# Patient Record
Sex: Male | Born: 1965 | Race: White | Hispanic: No | Marital: Married | Smoking: Never smoker
Health system: Southern US, Community
[De-identification: ages and names within clinical notes are randomized; demographics above are authoritative.]

## PROBLEM LIST (undated history)

## (undated) DIAGNOSIS — R569 Unspecified convulsions: Secondary | ICD-10-CM

## (undated) DIAGNOSIS — I2699 Other pulmonary embolism without acute cor pulmonale: Secondary | ICD-10-CM

## (undated) DIAGNOSIS — IMO0001 Reserved for inherently not codable concepts without codable children: Secondary | ICD-10-CM

## (undated) DIAGNOSIS — M1990 Unspecified osteoarthritis, unspecified site: Secondary | ICD-10-CM

## (undated) DIAGNOSIS — Z531 Procedure and treatment not carried out because of patient's decision for reasons of belief and group pressure: Secondary | ICD-10-CM

## (undated) DIAGNOSIS — I82409 Acute embolism and thrombosis of unspecified deep veins of unspecified lower extremity: Secondary | ICD-10-CM

## (undated) HISTORY — PX: TONSILLECTOMY: SUR1361

## (undated) HISTORY — PX: ANKLE RECONSTRUCTION: SHX1151

---

## 2001-08-29 ENCOUNTER — Emergency Department (HOSPITAL_COMMUNITY): Admission: EM | Admit: 2001-08-29 | Discharge: 2001-08-30 | Payer: Self-pay | Admitting: *Deleted

## 2002-07-04 ENCOUNTER — Ambulatory Visit (HOSPITAL_COMMUNITY): Admission: RE | Admit: 2002-07-04 | Discharge: 2002-07-04 | Payer: Self-pay | Admitting: Orthopedic Surgery

## 2002-07-04 ENCOUNTER — Encounter: Payer: Self-pay | Admitting: Orthopedic Surgery

## 2003-11-24 ENCOUNTER — Encounter: Admission: RE | Admit: 2003-11-24 | Discharge: 2003-11-24 | Payer: Self-pay | Admitting: Family Medicine

## 2004-01-13 ENCOUNTER — Ambulatory Visit (HOSPITAL_COMMUNITY): Admission: RE | Admit: 2004-01-13 | Discharge: 2004-01-13 | Payer: Self-pay | Admitting: *Deleted

## 2004-02-20 ENCOUNTER — Encounter: Admission: RE | Admit: 2004-02-20 | Discharge: 2004-02-20 | Payer: Self-pay | Admitting: Neurosurgery

## 2004-03-06 ENCOUNTER — Encounter: Admission: RE | Admit: 2004-03-06 | Discharge: 2004-03-06 | Payer: Self-pay | Admitting: Neurosurgery

## 2004-03-26 ENCOUNTER — Encounter: Admission: RE | Admit: 2004-03-26 | Discharge: 2004-03-26 | Payer: Self-pay | Admitting: Neurosurgery

## 2006-05-19 ENCOUNTER — Encounter: Admission: RE | Admit: 2006-05-19 | Discharge: 2006-05-19 | Payer: Self-pay | Admitting: Neurosurgery

## 2006-07-09 ENCOUNTER — Encounter: Admission: RE | Admit: 2006-07-09 | Discharge: 2006-07-09 | Payer: Self-pay | Admitting: Neurosurgery

## 2010-10-27 ENCOUNTER — Encounter: Payer: Self-pay | Admitting: Neurosurgery

## 2014-05-23 ENCOUNTER — Other Ambulatory Visit: Payer: Self-pay | Admitting: Surgical

## 2014-05-29 ENCOUNTER — Encounter (HOSPITAL_COMMUNITY): Payer: Self-pay | Admitting: Pharmacist

## 2014-05-30 NOTE — H&P (Signed)
Ronald Porter DOB: 04/25/1966 Male   Chief Complaint: right knee pain  History of Present Illness  The patient is a 48 year old male who presents with the chief complaint of right knee pain. He says that he has had this four months. He says that four months ago he was doing some work in the yard when he stepped down and felt like her hyperextended his knee. He also has history of psoriatic arthritis. He feels that because of this trauma he had a flare of the arthritis. He says it has been getting progressively worse over the past four months. He is having some locking and catching. Most of his pain is along the medial aspect of his knee. He is having some swelling as well. 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 It requires that he lifts heavy weights and has to push and pull heavy weights. He has had to be out of work for the past couple of months as a result of his right knee. He was seen at the [facility] and his rheumatologist. He did have x-rays as well as MRI of the right knee. He was told that he had a medial meniscus tear. 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  Allergies Simvastatin *ANTIHYPERLIPIDEMICS* NexIUM *ULCER DRUGS*  Family History Cancer Father, Mother. Drug / Alcohol Addiction Father. Heart Disease Father. Hypertension Father. Osteoarthritis Father.  Social History Children 1 Current drinker 05/12/2014: Currently drinks beer, wine and hard liquor only occasionally per week Current work status working full time Exercise Exercises rarely; does running / walking Living situation live with spouse Marital status married Tobacco / smoke exposure no Tobacco use Never smoker.   Medication History enoxaparin (LOVENOX) 150 MG/ML injection, Inject 150 mg into the skin every 12 (twelve) hours.,   warfarin (COUMADIN) 5 MG tablet, Take 5-7.5 mg by mouth daily  at 6 PM. Takes  daily except 7.5mg  on MWF.,   Past Surgical History Ankle Surgery left Tonsillectomy  Past Medical History Autoimmune disorder Blood Clot Hypercholesterolemia  Review of Systems General Not Present- Appetite Loss, Chills, Fatigue, Feeling sick, Fever, Night Sweats, Weight Gain and Weight Loss. Skin Present- Psoriasis. Not Present- Change in Hair or Nails, Itching, Rash, Skin Color Changes and Ulcer. HEENT Not Present- Hearing problems, Nose Bleed, Ringing in the Ears and Sensitivity to light. Respiratory Present- Dyspnea. Not Present- Bloody sputum, Chronic Cough and Snoring. Cardiovascular Present- Swelling of Extremities. Not Present- Chest Pain, Leg Cramps, Palpitations and Shortness of Breath. Gastrointestinal Not Present- Abdominal Pain, Bloody Stool, Heartburn, Incontinence of Stool, Nausea and Vomiting. Male Genitourinary Not Present- Blood in Urine, Frequency, Incontinence and Nocturia. Musculoskeletal Present- Joint Pain, Joint Stiffness and Joint Swelling. Not Present- Back Pain, Muscle Pain and Muscle Weakness. Neurological Present- Numbness and Tingling. Not Present- Burning, Dizziness, Headaches and Tremor. Psychiatric Present- Anxiety. Not Present- Depression and Memory Loss. Endocrine Not Present- Cold Intolerance, Excessive hunger, Excessive Thirst and Heat Intolerance. Hematology Present- Blood Clots. Not Present- Abnormal Bleeding, Anemia and Easy Bruising.   Vitals  Weight: 340 lb Height: 73in Body Surface Area: 2.82 m Body Mass Index: 44.86 kg/m BP: 119/94 (Sitting, Left Arm, Standard) HR: 76 bpm   Objective Transcription On exam, he is alert and oriented. He is in no acute distress. He is morbidly obese. Left knee is non-tender to palpation. No effusion or instability noted. The right knee he is tender along  the medial and lateral joint lines. Greater tenderness medially. Mild soft tissue swelling but no effusion. He does  lack a couple degrees of extension and flexion back to 120 degrees. He does have pain with passive and active range of motion of the right knee. No laxity of varus or valgus stress. Negative Lachman. Negative anterior drawer. Calves are soft and non-tender. Distal pulses are 2+. Sensation and motor function intact in the lower extremities.Heart sounds normal. No murmur. RRR. Lungs clear to auscultation. Absomen soft and nontender. EOM intact.    Assessment & Plan Right knee medial meniscus tear He needs a right knee arthroscopy with partial medial menisectomy. This will be an outpatient procedure. He will be off his Coumadin and bridged with Lovenox prior to surgery. Risks and benefits of the procedure were discussed with the patient by Dr. Darrelyn Hillock.    H&P performed by Dr. Darrelyn Hillock H&P documented by Dimitri Ped, PA-C

## 2014-06-01 ENCOUNTER — Encounter (HOSPITAL_COMMUNITY)
Admission: RE | Admit: 2014-06-01 | Discharge: 2014-06-01 | Disposition: A | Discharge status: Home / Self Care | Payer: Non-veteran care | Source: Ambulatory Visit | Attending: Orthopedic Surgery | Admitting: Orthopedic Surgery

## 2014-06-01 ENCOUNTER — Encounter (HOSPITAL_COMMUNITY): Payer: Self-pay

## 2014-06-01 DIAGNOSIS — Z01818 Encounter for other preprocedural examination: Secondary | ICD-10-CM | POA: Diagnosis present

## 2014-06-01 DIAGNOSIS — M23305 Other meniscus derangements, unspecified medial meniscus, unspecified knee: Secondary | ICD-10-CM | POA: Diagnosis not present

## 2014-06-01 DIAGNOSIS — M1710 Unilateral primary osteoarthritis, unspecified knee: Secondary | ICD-10-CM | POA: Insufficient documentation

## 2014-06-01 HISTORY — DX: Unspecified osteoarthritis, unspecified site: M19.90

## 2014-06-01 HISTORY — DX: Unspecified convulsions: R56.9

## 2014-06-01 HISTORY — DX: Reserved for inherently not codable concepts without codable children: IMO0001

## 2014-06-01 HISTORY — DX: Other pulmonary embolism without acute cor pulmonale: I26.99

## 2014-06-01 HISTORY — DX: Procedure and treatment not carried out because of patient's decision for reasons of belief and group pressure: Z53.1

## 2014-06-01 HISTORY — DX: Acute embolism and thrombosis of unspecified deep veins of unspecified lower extremity: I82.409

## 2014-06-01 LAB — CBC
HCT: 44.6 % (ref 39.0–52.0)
Hemoglobin: 15.3 g/dL (ref 13.0–17.0)
MCH: 31.2 pg (ref 26.0–34.0)
MCHC: 34.3 g/dL (ref 30.0–36.0)
MCV: 91 fL (ref 78.0–100.0)
PLATELETS: 245 10*3/uL (ref 150–400)
RBC: 4.9 MIL/uL (ref 4.22–5.81)
RDW: 13.2 % (ref 11.5–15.5)
WBC: 6.6 10*3/uL (ref 4.0–10.5)

## 2014-06-01 LAB — APTT: aPTT: 42 seconds — ABNORMAL HIGH (ref 24–37)

## 2014-06-01 LAB — NO BLOOD PRODUCTS

## 2014-06-01 LAB — PROTIME-INR
INR: 2.01 — AB (ref 0.00–1.49)
PROTHROMBIN TIME: 22.8 s — AB (ref 11.6–15.2)

## 2014-06-01 NOTE — Pre-Procedure Instructions (Signed)
06-01-14 No EKG or CXR needed today per Dr. Leta Jungling, anesthesiologist.

## 2014-06-01 NOTE — Patient Instructions (Addendum)
20 Ronald Porter  06/01/2014   Your procedure is scheduled on:  9-2 -2015 Wednesday  Enter through [facility] Entrance and follow signs to [facility]. Arrive at   0930     AM..  Call this number if you have problems the morning of surgery: [phone]  Or Presurgical Testing [phone].   For Living Will and/or Health Care Power Attorney Forms: please provide copy for your medical record,may bring AM of surgery(Forms should be already notarized -we do not provide this service).(06-01-14 Yes/ information placed with chart  today).     Do not eat food/ or drink: After Midnight.     Take these medicines the morning of surgery with A SIP OF WATER: none. Follow MD office instructions for Coumadin/Lovenox use.   Do not wear jewelry, make-up or nail polish.  Do not wear lotions, powders, or perfumes. You may wear deodorant.  Do not shave 48 hours(2 days) prior to first CHG shower(legs and under arms).(Shaving face and neck okay.)  Do not bring valuables to the hospital.(Hospital is not responsible for lost valuables).  Contacts, dentures or removable bridgework, body piercing, hair pins may not be worn into surgery.  Leave suitcase in the car. After surgery it may be brought to your room.  For patients admitted to the hospital, checkout time is 11:00 AM the day of discharge.(Restricted visitors-Any Persons displaying flu-like symptoms or illness).    Patients discharged the day of surgery will not be allowed to drive home. Must have responsible person with you x 24 hours once discharged.  Name and phone number of your driver: Ronald Porter [phone] cell  Special Instructions: CHG(Chlorhedine 4%-"Hibiclens","Betasept","Aplicare") Shower Use Special Wash: see special instructions.(avoid face and genitals)        _________________________    [facility] - Preparing for Surgery Before surgery, you can play an important role.  Because skin is not sterile, your skin  needs to be as free of germs as possible.  You can reduce the number of germs on your skin by washing with CHG (chlorahexidine gluconate) soap before surgery.  CHG is an antiseptic cleaner which kills germs and bonds with the skin to continue killing germs even after washing. Please DO NOT use if you have an allergy to CHG or antibacterial soaps.  If your skin becomes reddened/irritated stop using the CHG and inform your nurse when you arrive at Short Stay. Do not shave (including legs and underarms) for at least 48 hours prior to the first CHG shower.  You may shave your face/neck. Please follow these instructions carefully:  1.  Shower with CHG Soap the night before surgery and the  morning of Surgery.  2.  If you choose to wash your hair, wash your hair first as usual with your  normal  shampoo.  3.  After you shampoo, rinse your hair and body thoroughly to remove the  shampoo.                           4.  Use CHG as you would any other liquid soap.  You can apply chg directly  to the skin and wash                       Gently with a scrungie or clean washcloth.  5.  Apply the CHG Soap to your body ONLY FROM THE NECK DOWN.   Do not use on face/  open                           Wound or open sores. Avoid contact with eyes, ears mouth and genitals (private parts).                       Wash face,  Genitals (private parts) with your normal soap.             6.  Wash thoroughly, paying special attention to the area where your surgery  will be performed.  7.  Thoroughly rinse your body with warm water from the neck down.  8.  DO NOT shower/wash with your normal soap after using and rinsing off  the CHG Soap.                9.  Pat yourself dry with a clean towel.            10.  Wear clean pajamas.            11.  Place clean sheets on your bed the night of your first shower and do not  sleep with pets. Day of Surgery : Do not apply any lotions/deodorants the morning of surgery.  Please wear clean  clothes to the hospital/surgery center.  FAILURE TO FOLLOW THESE INSTRUCTIONS MAY RESULT IN THE CANCELLATION OF YOUR SURGERY PATIENT SIGNATURE_________________________________  NURSE SIGNATURE__________________________________  ________________________________________________________________________

## 2014-06-01 NOTE — Progress Notes (Signed)
06-01-14 Pt signed refusal to permit blood transfusions"Jehovah Witness".

## 2014-06-01 NOTE — Pre-Procedure Instructions (Signed)
06-01-14 1500 Labs viewable in Epic. Pt using coumadin and Lovenox-will repeat PT/INR on arrival and BMP(specimen hemolyzed today).

## 2014-06-06 MED ORDER — DEXTROSE 5 % IV SOLN
3.0000 g | INTRAVENOUS | Status: AC
  Administered 2014-06-07: 3 g via INTRAVENOUS
  Filled 2014-06-06: qty 3000

## 2014-06-06 NOTE — Progress Notes (Signed)
Pt notified of time change to 11:00 am - instructed to arrive at short stay center at 9:00 am. Pt also states he has a slight sore throat  / cough. Instructed pt to call Dr. Jeannetta Ellis office and let him know. ( [facility] )

## 2014-06-07 ENCOUNTER — Ambulatory Visit (HOSPITAL_COMMUNITY): Payer: No Typology Code available for payment source | Admitting: *Deleted

## 2014-06-07 ENCOUNTER — Encounter (HOSPITAL_COMMUNITY): Payer: Self-pay | Admitting: *Deleted

## 2014-06-07 ENCOUNTER — Ambulatory Visit (HOSPITAL_COMMUNITY)
Admission: RE | Admit: 2014-06-07 | Discharge: 2014-06-07 | Disposition: A | Discharge status: Home / Self Care | Payer: No Typology Code available for payment source | Source: Ambulatory Visit | Attending: Orthopedic Surgery | Admitting: Orthopedic Surgery

## 2014-06-07 ENCOUNTER — Encounter (HOSPITAL_COMMUNITY): Payer: No Typology Code available for payment source | Admitting: *Deleted

## 2014-06-07 ENCOUNTER — Encounter (HOSPITAL_COMMUNITY)
Admission: RE | Disposition: A | Discharge status: Home / Self Care | Payer: Self-pay | Source: Ambulatory Visit | Attending: Orthopedic Surgery

## 2014-06-07 DIAGNOSIS — M1711 Unilateral primary osteoarthritis, right knee: Secondary | ICD-10-CM | POA: Diagnosis present

## 2014-06-07 DIAGNOSIS — M23329 Other meniscus derangements, posterior horn of medial meniscus, unspecified knee: Secondary | ICD-10-CM | POA: Insufficient documentation

## 2014-06-07 DIAGNOSIS — M1710 Unilateral primary osteoarthritis, unspecified knee: Secondary | ICD-10-CM | POA: Insufficient documentation

## 2014-06-07 DIAGNOSIS — S83241A Other tear of medial meniscus, current injury, right knee, initial encounter: Secondary | ICD-10-CM

## 2014-06-07 DIAGNOSIS — Z79899 Other long term (current) drug therapy: Secondary | ICD-10-CM | POA: Insufficient documentation

## 2014-06-07 DIAGNOSIS — Z86718 Personal history of other venous thrombosis and embolism: Secondary | ICD-10-CM | POA: Diagnosis not present

## 2014-06-07 DIAGNOSIS — L4050 Arthropathic psoriasis, unspecified: Secondary | ICD-10-CM | POA: Insufficient documentation

## 2014-06-07 DIAGNOSIS — Z7901 Long term (current) use of anticoagulants: Secondary | ICD-10-CM | POA: Diagnosis not present

## 2014-06-07 DIAGNOSIS — M25569 Pain in unspecified knee: Secondary | ICD-10-CM | POA: Diagnosis present

## 2014-06-07 HISTORY — PX: KNEE ARTHROSCOPY WITH MEDIAL MENISECTOMY: SHX5651

## 2014-06-07 LAB — BASIC METABOLIC PANEL
Anion gap: 15 (ref 5–15)
BUN: 13 mg/dL (ref 6–23)
CHLORIDE: 99 meq/L (ref 96–112)
CO2: 24 meq/L (ref 19–32)
Calcium: 9.4 mg/dL (ref 8.4–10.5)
Creatinine, Ser: 1.05 mg/dL (ref 0.50–1.35)
GFR calc Af Amer: >90 mL/min (ref >90)
GFR calc non Af Amer: 83 mL/min — ABNORMAL LOW (ref >90)
GLUCOSE: 156 mg/dL — AB (ref 70–99)
POTASSIUM: 3.8 meq/L (ref 3.7–5.3)
SODIUM: 138 meq/L (ref 137–147)

## 2014-06-07 LAB — PROTIME-INR
INR: 1.01 (ref 0.00–1.49)
PROTHROMBIN TIME: 13.3 s (ref 11.6–15.2)

## 2014-06-07 SURGERY — ARTHROSCOPY, KNEE, WITH MEDIAL MENISCECTOMY
Anesthesia: General | Site: Knee | Laterality: Right

## 2014-06-07 MED ORDER — BUPIVACAINE-EPINEPHRINE (PF) 0.25% -1:200000 IJ SOLN
INTRAMUSCULAR | Status: AC
  Filled 2014-06-07: qty 30

## 2014-06-07 MED ORDER — PROPOFOL 10 MG/ML IV BOLUS
INTRAVENOUS | Status: AC
  Filled 2014-06-07: qty 20

## 2014-06-07 MED ORDER — BUPIVACAINE-EPINEPHRINE 0.25% -1:200000 IJ SOLN
INTRAMUSCULAR | Status: DC | PRN
  Administered 2014-06-07: 30 mL

## 2014-06-07 MED ORDER — OXYCODONE HCL 5 MG PO TABS: 5.0000 mg | ORAL_TABLET | Freq: Once | ORAL | Status: DC | PRN

## 2014-06-07 MED ORDER — PROPOFOL 10 MG/ML IV BOLUS
INTRAVENOUS | Status: DC | PRN
  Administered 2014-06-07: 300 mg via INTRAVENOUS

## 2014-06-07 MED ORDER — LIDOCAINE HCL (CARDIAC) 20 MG/ML IV SOLN
INTRAVENOUS | Status: DC | PRN
  Administered 2014-06-07: 100 mg via INTRAVENOUS

## 2014-06-07 MED ORDER — HYDROMORPHONE HCL PF 1 MG/ML IJ SOLN
INTRAMUSCULAR | Status: AC
  Filled 2014-06-07: qty 1

## 2014-06-07 MED ORDER — CHLORHEXIDINE GLUCONATE 4 % EX LIQD
60.0000 mL | Freq: Once | CUTANEOUS | Status: DC
Start: 2014-06-07 — End: 2014-06-07

## 2014-06-07 MED ORDER — LACTATED RINGERS IR SOLN
Status: DC | PRN
  Administered 2014-06-07: 3000 mL

## 2014-06-07 MED ORDER — ONDANSETRON HCL 4 MG/2ML IJ SOLN
INTRAMUSCULAR | Status: AC
  Filled 2014-06-07: qty 2

## 2014-06-07 MED ORDER — BACITRACIN ZINC 500 UNIT/GM EX OINT
TOPICAL_OINTMENT | CUTANEOUS | Status: DC | PRN
  Administered 2014-06-07: 1 via TOPICAL

## 2014-06-07 MED ORDER — FENTANYL CITRATE 0.05 MG/ML IJ SOLN
INTRAMUSCULAR | Status: DC | PRN
  Administered 2014-06-07 (×5): 50 ug via INTRAVENOUS

## 2014-06-07 MED ORDER — HYDROMORPHONE HCL PF 1 MG/ML IJ SOLN
0.2500 mg | INTRAMUSCULAR | Status: DC | PRN
Start: 2014-06-07 — End: 2014-06-07
  Administered 2014-06-07 (×4): 0.5 mg via INTRAVENOUS

## 2014-06-07 MED ORDER — PROMETHAZINE HCL 25 MG/ML IJ SOLN: 6.2500 mg | INTRAMUSCULAR | Status: DC | PRN

## 2014-06-07 MED ORDER — MIDAZOLAM HCL 2 MG/2ML IJ SOLN
INTRAMUSCULAR | Status: AC
  Filled 2014-06-07: qty 2

## 2014-06-07 MED ORDER — ONDANSETRON HCL 4 MG/2ML IJ SOLN
INTRAMUSCULAR | Status: DC | PRN
  Administered 2014-06-07: 4 mg via INTRAVENOUS

## 2014-06-07 MED ORDER — LIDOCAINE HCL (CARDIAC) 20 MG/ML IV SOLN
INTRAVENOUS | Status: AC
  Filled 2014-06-07: qty 5

## 2014-06-07 MED ORDER — LACTATED RINGERS IV SOLN
INTRAVENOUS | Status: DC
  Administered 2014-06-07: 1000 mL via INTRAVENOUS
  Administered 2014-06-07: 13:00:00 via INTRAVENOUS

## 2014-06-07 MED ORDER — MEPERIDINE HCL 50 MG/ML IJ SOLN: 6.2500 mg | INTRAMUSCULAR | Status: DC | PRN

## 2014-06-07 MED ORDER — FENTANYL CITRATE 0.05 MG/ML IJ SOLN
INTRAMUSCULAR | Status: AC
  Filled 2014-06-07: qty 5

## 2014-06-07 MED ORDER — OXYCODONE-ACETAMINOPHEN 10-325 MG PO TABS: 1.0000 | ORAL_TABLET | ORAL | Status: AC | PRN

## 2014-06-07 MED ORDER — MIDAZOLAM HCL 5 MG/5ML IJ SOLN
INTRAMUSCULAR | Status: DC | PRN
  Administered 2014-06-07: 2 mg via INTRAVENOUS

## 2014-06-07 MED ORDER — OXYCODONE HCL 5 MG/5ML PO SOLN
5.0000 mg | Freq: Once | ORAL | Status: DC | PRN
  Filled 2014-06-07: qty 5

## 2014-06-07 MED ORDER — BACITRACIN ZINC 500 UNIT/GM EX OINT
TOPICAL_OINTMENT | CUTANEOUS | Status: AC
  Filled 2014-06-07: qty 28.35

## 2014-06-07 SURGICAL SUPPLY — 28 items
BANDAGE ELASTIC 4 VELCRO ST LF (GAUZE/BANDAGES/DRESSINGS) ×2 IMPLANT
BANDAGE ELASTIC 6 VELCRO ST LF (GAUZE/BANDAGES/DRESSINGS) ×1 IMPLANT
BLADE GREAT WHITE 4.2 (BLADE) IMPLANT
BNDG COHESIVE 6X5 TAN STRL LF (GAUZE/BANDAGES/DRESSINGS) ×2 IMPLANT
BNDG GAUZE ELAST 4 BULKY (GAUZE/BANDAGES/DRESSINGS) ×1 IMPLANT
CLOTH BEACON ORANGE TIMEOUT ST (SAFETY) ×2 IMPLANT
COUNTER NEEDLE 20 DBL MAG RED (NEEDLE) ×2 IMPLANT
DRAPE LG THREE QUARTER DISP (DRAPES) ×2 IMPLANT
DRSG ADAPTIC 3X8 NADH LF (GAUZE/BANDAGES/DRESSINGS) ×2 IMPLANT
DRSG PAD ABDOMINAL 8X10 ST (GAUZE/BANDAGES/DRESSINGS) ×4 IMPLANT
DURAPREP 26ML APPLICATOR (WOUND CARE) ×2 IMPLANT
GAUZE SPONGE 4X4 12PLY STRL (GAUZE/BANDAGES/DRESSINGS) ×1 IMPLANT
GLOVE BIOGEL PI IND STRL 8 (GLOVE) ×1 IMPLANT
GLOVE BIOGEL PI INDICATOR 8 (GLOVE) ×1
GLOVE ECLIPSE 8.0 STRL XLNG CF (GLOVE) ×2 IMPLANT
GOWN STRL REUS W/TWL XL LVL3 (GOWN DISPOSABLE) ×2 IMPLANT
MANIFOLD NEPTUNE II (INSTRUMENTS) ×2 IMPLANT
PACK ARTHROSCOPY WL (CUSTOM PROCEDURE TRAY) ×2 IMPLANT
PACK ICE MAXI GEL EZY WRAP (MISCELLANEOUS) ×2 IMPLANT
PAD ABD 8X10 STRL (GAUZE/BANDAGES/DRESSINGS) ×1 IMPLANT
PAD MASON LEG HOLDER (PIN) ×2 IMPLANT
SET ARTHROSCOPY TUBING (MISCELLANEOUS) ×2
SET ARTHROSCOPY TUBING LN (MISCELLANEOUS) ×1 IMPLANT
SUT ETHILON 3 0 PS 1 (SUTURE) ×2 IMPLANT
TOWEL OR 17X26 10 PK STRL BLUE (TOWEL DISPOSABLE) ×2 IMPLANT
TUBING CONNECTING 10 (TUBING) ×1 IMPLANT
WAND 90 DEG TURBOVAC W/CORD (SURGICAL WAND) ×2 IMPLANT
WRAP KNEE MAXI GEL POST OP (GAUZE/BANDAGES/DRESSINGS) ×2 IMPLANT

## 2014-06-07 NOTE — Interval H&P Note (Signed)
History and Physical Interval Note:  06/07/2014 11:05 AM  Ronald Porter  has presented today for surgery, with the diagnosis of RIGHT KNEE MEDIAL MENISCAL TEAR   The various methods of treatment have been discussed with the patient and family. After consideration of risks, benefits and other options for treatment, the patient has consented to  Procedure(s): RIGHT KNEE ARTHROSCOPY WITH MEDIAL MENISCECTOMY (Right) as a surgical intervention .  The patient's history has been reviewed, patient examined, no change in status, stable for surgery.  I have reviewed the patient's chart and labs.  Questions were answered to the patient's satisfaction.     Creston Klas A

## 2014-06-07 NOTE — Anesthesia Preprocedure Evaluation (Signed)
Anesthesia Evaluation  Patient identified by MRN, date of birth, ID band Patient awake    Reviewed: Allergy & Precautions, H&P , NPO status , Patient's Chart, lab work & pertinent test results  Airway Mallampati: II TM Distance: >3 FB Neck ROM: Full    Dental no notable dental hx.    Pulmonary neg pulmonary ROS,  breath sounds clear to auscultation  Pulmonary exam normal       Cardiovascular negative cardio ROS  Rhythm:Regular Rate:Normal     Neuro/Psych negative neurological ROS  negative psych ROS   GI/Hepatic negative GI ROS, Neg liver ROS,   Endo/Other  Morbid obesity  Renal/GU negative Renal ROS     Musculoskeletal  (+) Arthritis -, Osteoarthritis,    Abdominal (+) + obese,   Peds  Hematology negative hematology ROS (+) REFUSES BLOOD PRODUCTS, JEHOVAH'S WITNESS  Anesthesia Other Findings   Reproductive/Obstetrics negative OB ROS                           Anesthesia Physical Anesthesia Plan  ASA: III  Anesthesia Plan: General   Post-op Pain Management:    Induction: Intravenous  Airway Management Planned: LMA  Additional Equipment:   Intra-op Plan:   Post-operative Plan: Extubation in OR  Informed Consent: I have reviewed the patients History and Physical, chart, labs and discussed the procedure including the risks, benefits and alternatives for the proposed anesthesia with the patient or authorized representative who has indicated his/her understanding and acceptance.   Dental advisory given  Plan Discussed with: CRNA  Anesthesia Plan Comments:         Anesthesia Quick Evaluation

## 2014-06-07 NOTE — Brief Op Note (Signed)
06/07/2014  12:15 PM  PATIENT:  Ronald Porter  48 y.o. male  PRE-OPERATIVE DIAGNOSIS:  RIGHT KNEE MEDIAL MENISCAL TEAR  And Osteoarthritis   POST-OPERATIVE DIAGNOSIS:  RIGHT KNEE MEDIAL MENISCAL TEAR and Osteoarthritis   PROCEDURE:  Procedure(s): RIGHT KNEE ARTHROSCOPY WITH MEDIAL MENISCECTOMY (Right) and Abraision Chondroplasty of Medial Femoral Condyle and Patella.  SURGEON:  Surgeon(s) and Role:    * Jacki Cones, MD - Primary     ASSISTANTS:OR Nurse   ANESTHESIA:   general  EBL:     BLOOD ADMINISTERED:none  DRAINS: none   LOCAL MEDICATIONS USED:  MARCAINE 30cc 39f 0.25% with Epinephrine     SPECIMEN:  No Specimen  DISPOSITION OF SPECIMEN:  N/A  COUNTS:  YES  TOURNIQUET:  * No tourniquets in log *  DICTATION: .Other Dictation: Dictation Number [id number]  PLAN OF CARE: Discharge to home after PACU  PATIENT DISPOSITION:  PACU - hemodynamically stable.   Delay start of Pharmacological VTE agent (>24hrs) due to surgical blood loss or risk of bleeding: yes

## 2014-06-07 NOTE — Transfer of Care (Signed)
Immediate Anesthesia Transfer of Care Note  Patient: Ronald Porter  Procedure(s) Performed: Procedure(s): RIGHT KNEE ARTHROSCOPY WITH MEDIAL MENISCECTOMY (Right)  Patient Location: PACU  Anesthesia Type:General  Level of Consciousness: awake, alert , oriented and patient cooperative  Airway & Oxygen Therapy: Patient Spontanous Breathing and Patient connected to face mask oxygen  Post-op Assessment: Report given to PACU RN, Post -op Vital signs reviewed and stable and Patient moving all extremities  Post vital signs: Reviewed and stable  Complications: No apparent anesthesia complications

## 2014-06-07 NOTE — Progress Notes (Signed)
When awake c/o pain 10 Rt knee. Falls asleep easily. Meds given.

## 2014-06-07 NOTE — Anesthesia Postprocedure Evaluation (Signed)
Anesthesia Post Note  Patient: Ronald Porter  Procedure(s) Performed: Procedure(s) (LRB): RIGHT KNEE ARTHROSCOPY WITH MEDIAL MENISCECTOMY (Right)  Anesthesia type: General  Patient location: PACU  Post pain: Pain level controlled  Post assessment: Post-op Vital signs reviewed  Last Vitals: BP 128/84  Pulse 69  Temp(Src) 36.9 C (Oral)  Resp 18  SpO2 91%  Post vital signs: Reviewed  Level of consciousness: sedated  Complications: No apparent anesthesia complications

## 2014-06-08 ENCOUNTER — Encounter (HOSPITAL_COMMUNITY): Payer: Self-pay | Admitting: Orthopedic Surgery

## 2014-06-08 NOTE — Op Note (Signed)
Ronald Porter, Ronald Porter NO.:  [id number]  MEDICAL RECORD NO.:  [id number]  LOCATION:  [facility]                         FACILITY:  [facility]  PHYSICIAN:  Georges Lynch. Margeaux Swantek, M.D.DATE OF BIRTH:  02-02-1966  DATE OF PROCEDURE:  06/07/2014 DATE OF DISCHARGE:  06/07/2014                              OPERATIVE REPORT   SURGEON:  Windy Fast A. Shanequia Kendrick, M.D.  ASSISTANT:  OR nurse.  PREOPERATIVE DIAGNOSES: 1. Osteoarthritis of the right knee. 2. Torn medial meniscus, posterior horn, right knee.  POSTOPERATIVE DIAGNOSES: 1. Osteoarthritis of the right knee. 2. Torn medial meniscus, posterior horn, right knee.  OPERATION: 1. Diagnostic arthroscopy, right knee. 2. Medial meniscectomy, right knee. 3. Abrasion chondroplasty of the medial femoral condyle, right knee. 4. Abrasion chondroplasty of the patella, right knee down the bleeding     bone.  No microfracture was necessary.  DESCRIPTION OF PROCEDURE:  Under general anesthesia, routine orthopedic prepping and draping of the right lower extremity was carried out.  The right lower extremity was placed in a knee holder.  The patient's appropriate time-out was carried out.  I also marked the appropriate right leg in the holding area.  At this time, he had 3 g IV Ancef. After the sterile prep and draping was carried out, I made a small punctate incision in the suprapatellar pouch.  Inflow cannula was inserted and the knee was distended with saline.  Another small punctate incision made in the anterolateral joint.  The arthroscope was entered from lateral approach and a complete diagnostic arthroscopy was carried out.  I went up in the suprapatellar region.  He had marked synovitis as well as significant chondromalacia of the patella.  I did an abrasion chondroplasty at this time, and synovectomy.  I went down the lateral joint, the lateral joint was clean.  The meniscus was intact.  Cruciates were intact.  Medial joint, he had a  tear of the posterior horn medial meniscus.  We introduced the ArthroCare and did approach partial medial meniscectomy.  The main issue in his medial joint was the osteoarthritic changes of the distal femur.  The cartilage was literally hanging off like __________.  I simply shaved that down in the usual fashion for the abrasion chondroplasty, bleeding bone.  No microfracture was necessary. This was short with complete all the way across the condyle that was involved.  We thoroughly irrigated out the area.  I removed the fluid, closed all 3 punctate incisions with 3-0 nylon suture.  I injected 30 mL of 0.25% Marcaine with epinephrine in the knee joint and a sterile Neosporin dressing was applied.  Note, he was on Coumadin preop.  He will resume his Coumadin this evening and he will be on crutches, partial weight and I will see him in the office in about 10 or 12 days or prior to if there is a problem.  Pain medicine I am going to put him on is Percocet 10/325, one every 3 hours p.r.n. for pain.          ______________________________ Georges Lynch. Darrelyn Hillock, M.D.     RAG/MEDQ  D:  06/07/2014  T:  06/08/2014  Job:  [id number]

## 2020-05-01 ENCOUNTER — Other Ambulatory Visit: Payer: Self-pay

## 2020-05-01 ENCOUNTER — Ambulatory Visit: Payer: Non-veteran care | Attending: Internal Medicine

## 2020-05-01 DIAGNOSIS — Z23 Encounter for immunization: Secondary | ICD-10-CM

## 2020-05-01 NOTE — Progress Notes (Signed)
   Covid-19 Vaccination Clinic  Name:  Ronald Porter    MRN: [id number] DOB: Jan 02, 1966  05/01/2020  Ronald Porter was observed post Covid-19 immunization for 15 minutes without incident. He was provided with Vaccine Information Sheet and instruction to access the V-Safe system.   Ronald Porter was instructed to call 911 with any severe reactions post vaccine: Marland Kitchen Difficulty breathing  . Swelling of face and throat  . A fast heartbeat  . A bad rash all over body  . Dizziness and weakness   Immunizations Administered    Name Date Dose VIS Date Route   Pfizer COVID-19 Vaccine 05/01/2020 12:52 PM 0.3 mL 11/30/2018 Intramuscular   Manufacturer: [employer], [employer]   Lot: [id number]   NDC: [address]-4

## 2020-05-21 ENCOUNTER — Ambulatory Visit: Payer: Non-veteran care

## 2020-05-22 ENCOUNTER — Ambulatory Visit: Payer: Non-veteran care | Attending: Internal Medicine

## 2020-05-22 DIAGNOSIS — Z23 Encounter for immunization: Secondary | ICD-10-CM

## 2020-05-22 NOTE — Progress Notes (Signed)
   Covid-19 Vaccination Clinic  Name:  Ronald Porter    MRN: [id number] DOB: Mar 24, 1966  05/22/2020  Mr. Ronald Porter was observed post Covid-19 immunization for 15 minutes without incident. He was provided with Vaccine Information Sheet and instruction to access the V-Safe system.   Mr. Ronald Porter was instructed to call 911 with any severe reactions post vaccine: Marland Kitchen Difficulty breathing  . Swelling of face and throat  . A fast heartbeat  . A bad rash all over body  . Dizziness and weakness   Immunizations Administered    Name Date Dose VIS Date Route   Pfizer COVID-19 Vaccine 05/22/2020  3:14 PM 0.3 mL 11/30/2018 Intramuscular   Manufacturer: [employer], [employer]   Lot: [id number]   NDC: [address]-8

## 2021-07-01 ENCOUNTER — Emergency Department (HOSPITAL_COMMUNITY)
Admission: EM | Admit: 2021-07-01 | Discharge: 2021-07-01 | Disposition: A | Discharge status: Home / Self Care | Payer: No Typology Code available for payment source | Attending: Emergency Medicine | Admitting: Emergency Medicine

## 2021-07-01 ENCOUNTER — Emergency Department (HOSPITAL_COMMUNITY): Payer: No Typology Code available for payment source

## 2021-07-01 ENCOUNTER — Other Ambulatory Visit: Payer: Self-pay

## 2021-07-01 DIAGNOSIS — R0789 Other chest pain: Secondary | ICD-10-CM | POA: Insufficient documentation

## 2021-07-01 DIAGNOSIS — S8002XA Contusion of left knee, initial encounter: Secondary | ICD-10-CM | POA: Insufficient documentation

## 2021-07-01 DIAGNOSIS — Z7901 Long term (current) use of anticoagulants: Secondary | ICD-10-CM | POA: Insufficient documentation

## 2021-07-01 DIAGNOSIS — Y92410 Unspecified street and highway as the place of occurrence of the external cause: Secondary | ICD-10-CM | POA: Insufficient documentation

## 2021-07-01 DIAGNOSIS — S8992XA Unspecified injury of left lower leg, initial encounter: Secondary | ICD-10-CM | POA: Diagnosis present

## 2021-07-01 LAB — COMPREHENSIVE METABOLIC PANEL
ALT: 17 U/L (ref 0–44)
AST: 47 U/L — ABNORMAL HIGH (ref 15–41)
Albumin: 3.7 g/dL (ref 3.5–5.0)
Alkaline Phosphatase: 75 U/L (ref 38–126)
Anion gap: 8 (ref 5–15)
BUN: 9 mg/dL (ref 6–20)
CO2: 25 mmol/L (ref 22–32)
Calcium: 8.8 mg/dL — ABNORMAL LOW (ref 8.9–10.3)
Chloride: 105 mmol/L (ref 98–111)
Creatinine, Ser: 0.87 mg/dL (ref 0.61–1.24)
GFR, Estimated: >60 mL/min (ref >60)
Glucose, Bld: 195 mg/dL — ABNORMAL HIGH (ref 70–99)
Potassium: 5.1 mmol/L (ref 3.5–5.1)
Sodium: 138 mmol/L (ref 135–145)
Total Bilirubin: 1.7 mg/dL — ABNORMAL HIGH (ref 0.3–1.2)

## 2021-07-01 LAB — CBC WITH DIFFERENTIAL/PLATELET
Abs Immature Granulocytes: 0.05 10*3/uL (ref 0.00–0.07)
Basophils Absolute: 0.1 10*3/uL (ref 0.0–0.1)
Basophils Relative: 1 %
Eosinophils Absolute: 0.2 10*3/uL (ref 0.0–0.5)
Eosinophils Relative: 3 %
HCT: 45.7 % (ref 39.0–52.0)
Hemoglobin: 15.9 g/dL (ref 13.0–17.0)
Immature Granulocytes: 1 %
Lymphocytes Relative: 30 %
Lymphs Abs: 2.3 10*3/uL (ref 0.7–4.0)
MCH: 33.1 pg (ref 26.0–34.0)
MCHC: 34.8 g/dL (ref 30.0–36.0)
MCV: 95.2 fL (ref 80.0–100.0)
Monocytes Absolute: 0.8 10*3/uL (ref 0.1–1.0)
Monocytes Relative: 10 %
Neutro Abs: 4.3 10*3/uL (ref 1.7–7.7)
Neutrophils Relative %: 55 %
Platelets: 191 10*3/uL (ref 150–400)
RBC: 4.8 MIL/uL (ref 4.22–5.81)
RDW: 12.6 % (ref 11.5–15.5)
WBC: 7.7 10*3/uL (ref 4.0–10.5)
nRBC: 0 % (ref 0.0–0.2)

## 2021-07-01 NOTE — Discharge Instructions (Addendum)
Take over-the-counter medications as needed for pain.  Try applying ice to help with swelling and discomfort for the next day or 2.  Return as needed for worsening symptoms including severe headache, abdominal pain, vomiting
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
## 2021-07-01 NOTE — ED Notes (Signed)
Pt d/c home per MD order. Discharge summary reviewed with pt, pt verbalizes understanding. No s/s of acute distress noted at discharge. Ambulatory off unit.  
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
